# Patient Record
Sex: Male | Born: 1965
Health system: Southern US, Community
[De-identification: ages and names within clinical notes are randomized; demographics above are authoritative.]

## PROBLEM LIST (undated history)

## (undated) HISTORY — PX: KNEE SURGERY: SHX244

---

## 1994-08-09 HISTORY — PX: HAND SURGERY: SHX662

## 2006-07-07 ENCOUNTER — Emergency Department (HOSPITAL_COMMUNITY): Admission: EM | Admit: 2006-07-07 | Discharge: 2006-07-07 | Payer: Self-pay | Admitting: Emergency Medicine

## 2007-12-17 ENCOUNTER — Emergency Department (HOSPITAL_COMMUNITY): Admission: EM | Admit: 2007-12-17 | Discharge: 2007-12-17 | Payer: Self-pay | Admitting: Emergency Medicine

## 2008-09-23 ENCOUNTER — Ambulatory Visit (HOSPITAL_BASED_OUTPATIENT_CLINIC_OR_DEPARTMENT_OTHER): Admission: RE | Admit: 2008-09-23 | Discharge: 2008-09-23 | Payer: Self-pay | Admitting: Orthopedic Surgery

## 2009-05-06 ENCOUNTER — Emergency Department (HOSPITAL_COMMUNITY): Admission: EM | Admit: 2009-05-06 | Discharge: 2009-05-06 | Payer: Self-pay | Admitting: Family Medicine

## 2009-06-22 ENCOUNTER — Emergency Department (HOSPITAL_COMMUNITY): Admission: EM | Admit: 2009-06-22 | Discharge: 2009-06-22 | Payer: Self-pay | Admitting: Emergency Medicine

## 2010-11-24 LAB — POCT HEMOGLOBIN-HEMACUE: Hemoglobin: 14.5 g/dL (ref 13.0–17.0)

## 2010-12-22 NOTE — Op Note (Signed)
NAME:  Michael Clarke, Michael Clarke                ACCOUNT NO.:  [id number]   MEDICAL RECORD NO.:  [id number]          PATIENT TYPE:  AMB   LOCATION:  [facility]                         FACILITY:  [facility]   PHYSICIAN:  Marlowe Kays, M.D.  DATE OF BIRTH:  04/17/1966   DATE OF PROCEDURE:  09/23/2008  DATE OF DISCHARGE:                               OPERATIVE REPORT   PREOPERATIVE DIAGNOSES:  1. Torn medial meniscus.  2. Osteoarthritis right knee.   POSTOPERATIVE DIAGNOSES:  1. Torn medial meniscus.  2. Osteoarthritis right knee.   OPERATION:  Right knee arthroscopy with:  1. Partial medial meniscectomy.  2. Shaving of medial femoral condyle.   SURGEON:  J. Aplington, M.D.   ASSISTANT:  Nurse.   ANESTHESIA:  General.   FINAL JUSTIFICATION FOR PROCEDURE:  Because of a right knee injury on  the job, he had right knee MRI performed ON May 02, 2008 showing a  complex degenerative tear of the posterior horn of the medial meniscus  associated with osteoarthritis, particularly the medial compartment.  He  has been somewhat reluctant to have surgery, but, because of progressive  pain, has now consented to have surgical correction.  Potential risks,  complications and expected results have been all thoroughly discussed  with him.   PROCEDURE:  Satisfactory general anesthesia, Ace wrap and knee support  to left lower extremity, pneumatic tourniquet to right lower extremity.  Right leg Esmarched out non-sterilely and the tourniquet inflated to 325  mmHg, a thigh stabilizer applied and the right leg prepped with DuraPrep  from stabilizer to ankle and draped in a sterile field.  Time-out  performed.  Superior medial saline inflow.  First through an  anteromedial portal lateral compartment, the joint was evaluated. It was  essentially normal with minimal fraying of the lateral meniscus and no  chondromalacia.  Looking at the lateral gutter and suprapatellar area,  he did have a little wear of the  patella, particularly in the medial  facet but there was nothing operable.  I then reversed portals.  The ACL  was intact.  He had a little bit of reactive synovitis anteriorly which  I resected with a 3.5 shaver.  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  The major  pathology was an extensive tear involving the entire posterior medial  meniscus from just prior to the curve into the intercondylar area with  an extensive complex tear around the posterior curve.  I resected the  torn portion of the meniscus back to a stable rim primarily with a small  angled upbiting basket and then shaved the remaining rim down until  smooth with a 3.5 shaver.  The remaining meniscus was stable on probing.  Final pictures were taken.  The joint was irrigated to clear and all  fluid possible removed.  I closed the 2 anterior portals with 4-0 nylon  and then injected 20 mL of half-  percent Marcaine, adrenaline and 4 mg of morphine through the inflow  apparatus which I removed and closed this portal with 4-0 nylon  as well.  Betadine and a dry sterile dressing were applied.  The tourniquet was  released.  He tolerated the procedure well and was taken to recovery in  satisfactory condition with no known complications.           ______________________________  Marlowe Kays, M.D.     JA/MEDQ  D:  09/23/2008  T:  09/23/2008  Job:  [id number]

## 2011-02-21 ENCOUNTER — Inpatient Hospital Stay (INDEPENDENT_AMBULATORY_CARE_PROVIDER_SITE_OTHER)
Admission: RE | Admit: 2011-02-21 | Discharge: 2011-02-21 | Disposition: A | Discharge status: Home / Self Care | Payer: BC Managed Care – PPO | Source: Ambulatory Visit | Attending: Family Medicine | Admitting: Family Medicine

## 2011-02-21 DIAGNOSIS — H109 Unspecified conjunctivitis: Secondary | ICD-10-CM

## 2013-07-19 ENCOUNTER — Encounter (INDEPENDENT_AMBULATORY_CARE_PROVIDER_SITE_OTHER): Payer: Self-pay

## 2013-07-19 ENCOUNTER — Encounter: Payer: Self-pay | Admitting: Internal Medicine

## 2013-07-19 ENCOUNTER — Ambulatory Visit: Payer: PRIVATE HEALTH INSURANCE | Attending: Internal Medicine | Admitting: Internal Medicine

## 2013-07-19 ENCOUNTER — Ambulatory Visit: Payer: PRIVATE HEALTH INSURANCE

## 2013-07-19 VITALS — BP 140/70 | HR 72 | Temp 98.2°F | Resp 16 | Wt 200.0 lb

## 2013-07-19 DIAGNOSIS — M25539 Pain in unspecified wrist: Secondary | ICD-10-CM | POA: Insufficient documentation

## 2013-07-19 DIAGNOSIS — M25532 Pain in left wrist: Secondary | ICD-10-CM | POA: Insufficient documentation

## 2013-07-19 DIAGNOSIS — M79675 Pain in left toe(s): Secondary | ICD-10-CM

## 2013-07-19 DIAGNOSIS — M79609 Pain in unspecified limb: Secondary | ICD-10-CM

## 2013-07-19 DIAGNOSIS — Z139 Encounter for screening, unspecified: Secondary | ICD-10-CM

## 2013-07-19 DIAGNOSIS — R030 Elevated blood-pressure reading, without diagnosis of hypertension: Secondary | ICD-10-CM

## 2013-07-19 MED ORDER — DICLOFENAC SODIUM 75 MG PO TBEC: 75.0000 mg | DELAYED_RELEASE_TABLET | Freq: Two times a day (BID) | ORAL | Status: DC

## 2013-07-19 NOTE — Progress Notes (Signed)
Patient here to establish care Complains of pain to left wrist- hurt it over the summer playing volleyball  And still not feeling better Has some pain to his toe on his left foot

## 2013-07-19 NOTE — Patient Instructions (Signed)

## 2013-07-19 NOTE — Progress Notes (Signed)
MRN: [id number] Name: Michael Clarke  Sex: male Age: 47 y.o. DOB: 11-22-1965  Allergies: Penicillins  Chief Complaint  Patient presents with  . Establish Care    HPI: Patient is 47 y.o. male who comes for the first time to establish medical care, denies any previous medical history today his blood pressure was elevated, denies any headache dizziness chest pain shortness of breath, repeat blood pressure is still 140/70 manual, reported to have family history of hypertension, patient also complained of left wrist pain for the last several months as per patient started after he was playing vollyball, he also reported to have on and off pain in his left foot second toe denies any fever chills or any swelling. Patient tried over-the-counter ibuprofen without much improvement.  History reviewed. No pertinent past medical history.  Past Surgical History  Procedure Laterality Date  . Knee surgery Right       Medication List       This list is accurate as of: 07/19/13 11:04 AM.  Always use your most recent med list.               diclofenac 75 MG EC tablet  Commonly known as:  VOLTAREN  Take 1 tablet (75 mg total) by mouth 2 (two) times daily.        Meds ordered this encounter  Medications  . diclofenac (VOLTAREN) 75 MG EC tablet    Sig: Take 1 tablet (75 mg total) by mouth 2 (two) times daily.    Dispense:  30 tablet    Refill:  2     There is no immunization history on file for this patient.  History  Substance Use Topics  . Smoking status: Heavy Tobacco Smoker -- 0.50 packs/day for 10 years  . Smokeless tobacco: Not on file  . Alcohol Use: Yes     Comment: occasional    Review of Systems  As noted in HPI  Filed Vitals:   07/19/13 1055  BP: 140/70  Pulse:   Temp:   Resp:     Physical Exam  Physical Exam  Constitutional: He is oriented to person, place, and time.  Eyes: EOM are normal. Pupils are equal, round, and reactive to light.  Cardiovascular:  Normal rate and regular rhythm.   Pulmonary/Chest: Breath sounds normal. No respiratory distress. He has no wheezes. He has no rales.  Musculoskeletal:  Left wrist Some tenderness on the radial side good range of motion  Left foot second toe no inflammation ? Callus formation at the tip nontender, no signs of infection.  Neurological: He is alert and oriented to person, place, and time.    CBC    Component Value Date/Time   HGB 14.5 09/23/2008 1352    CMP  No results found for this basename: na,  k,  cl,  co2,  glucose,  bun,  creatinine,  calcium,  prot,  albumin,  ast,  alt,  alkphos,  bilitot,  gfrnonaa,  gfraa    No results found for this basename: chol,  tri,  ldl    No components found with this basename: hga1c    No results found for this basename: AST    Assessment and Plan  Left wrist pain - Plan: DG Wrist Complete Left, diclofenac (VOLTAREN) 75 MG EC tablet  Pain in toe of left foot - Plan: diclofenac (VOLTAREN) 75 MG EC tablet ... if problem is persistent consider referral to podiatry.  Elevated BP... advised patient for low salt diet and  exercise.  Screening - Plan: CBC with Differential, COMPLETE METABOLIC PANEL WITH GFR, TSH, Lipid panel, Vit D  25 hydroxy (rtn osteoporosis monitoring)   Health Maintenance  patient declined for flu shot.  Return in about 6 weeks (around 08/30/2013).  Doris Cheadle, MD

## 2013-08-30 ENCOUNTER — Ambulatory Visit: Payer: Self-pay | Admitting: Internal Medicine

## 2013-09-19 ENCOUNTER — Telehealth: Payer: Self-pay

## 2013-09-19 NOTE — Telephone Encounter (Signed)
Patient called stating has been having some erectile dysfunction Would like us to prescribe cialis

## 2013-09-27 ENCOUNTER — Telehealth: Payer: Self-pay | Admitting: Internal Medicine

## 2013-09-27 NOTE — Telephone Encounter (Signed)
Returned patient call Patient stated he already had spoken to someone about his issue

## 2013-09-27 NOTE — Telephone Encounter (Signed)
Pt was last seen on 07/19/13.  Pt says he is having issues with erectile dysfunction and wanted to learn more about script for med like Cialis.

## 2013-10-01 ENCOUNTER — Ambulatory Visit: Payer: Self-pay

## 2013-11-28 ENCOUNTER — Encounter (HOSPITAL_COMMUNITY): Payer: Self-pay | Admitting: Emergency Medicine

## 2013-11-28 ENCOUNTER — Emergency Department (HOSPITAL_COMMUNITY)
Admission: EM | Admit: 2013-11-28 | Discharge: 2013-11-28 | Disposition: A | Discharge status: Home / Self Care | Payer: PRIVATE HEALTH INSURANCE | Source: Home / Self Care | Attending: Family Medicine | Admitting: Family Medicine

## 2013-11-28 DIAGNOSIS — L0291 Cutaneous abscess, unspecified: Secondary | ICD-10-CM

## 2013-11-28 DIAGNOSIS — L0390 Cellulitis, unspecified: Secondary | ICD-10-CM

## 2013-11-28 MED ORDER — SULFAMETHOXAZOLE-TRIMETHOPRIM 800-160 MG PO TABS: 2.0000 | ORAL_TABLET | Freq: Two times a day (BID) | ORAL | Status: DC

## 2013-11-28 MED ORDER — SULFAMETHOXAZOLE-TRIMETHOPRIM 800-160 MG PO TABS: 1.0000 | ORAL_TABLET | Freq: Two times a day (BID) | ORAL | Status: DC

## 2013-11-28 NOTE — ED Provider Notes (Signed)
Michael Clarke is a 48 y.o. male who presents to Urgent Care today for abscess. Patient developed an abscess on the skin of the right abdominal wall about 5 days ago. He was able to get it to drain 2 days ago. The pain is much better however he has a small indurated area surrounding the abscess. He thinks he possibly needs antibiotics. No fevers chills nausea vomiting or diarrhea. He is using warm compress daily.   History reviewed. No pertinent past medical history. History  Substance Use Topics  . Smoking status: Heavy Tobacco Smoker -- 0.50 packs/day for 10 years  . Smokeless tobacco: Not on file  . Alcohol Use: Yes     Comment: occasional   ROS as above Medications: No current facility-administered medications for this encounter.   Current Outpatient Prescriptions  Medication Sig Dispense Refill  . sulfamethoxazole-trimethoprim (SEPTRA DS) 800-160 MG per tablet Take 1 tablet by mouth 2 (two) times daily.  28 tablet  0    Exam:  BP 127/86  Pulse 69  Temp(Src) 98.4 F (36.9 C) (Oral)  Resp 18  SpO2 96% Gen: Well NAD Skin: Small indurated and mildly tender area surrounding old abscess. No fluctuance.    Assessment and Plan: 48 y.o. male with resolving abscess. Plan to treat with Bactrim. Followup as needed.  Discussed warning signs or symptoms. Please see discharge instructions. Patient expresses understanding.    Rodolph BongEvan S Travelle Mcclimans, MD 11/28/13 1052

## 2013-11-28 NOTE — ED Notes (Signed)
Pt c/o abscess on right side/rib cage onset 5 days Reports he popped the abscess and drained it completely  Just wants to make sure it's not infected or that he needs to be on antibiotics Pain has decreased from 10 to 2 Alert w/no signs of acute distress.

## 2013-11-28 NOTE — Discharge Instructions (Signed)
Thank you for coming in today. Continue warm compress.  Take Bactrim twice daily for one week.  Come back as needed  Abscess An abscess is an infected area that contains a collection of pus and debris.It can occur in almost any part of the body. An abscess is also known as a furuncle or boil. CAUSES  An abscess occurs when tissue gets infected. This can occur from blockage of oil or sweat glands, infection of hair follicles, or a minor injury to the skin. As the body tries to fight the infection, pus collects in the area and creates pressure under the skin. This pressure causes pain. People with weakened immune systems have difficulty fighting infections and get certain abscesses more often.  SYMPTOMS Usually an abscess develops on the skin and becomes a painful mass that is red, warm, and tender. If the abscess forms under the skin, you may feel a moveable soft area under the skin. Some abscesses break open (rupture) on their own, but most will continue to get worse without care. The infection can spread deeper into the body and eventually into the bloodstream, causing you to feel ill.  DIAGNOSIS  Your caregiver will take your medical history and perform a physical exam. A sample of fluid may also be taken from the abscess to determine what is causing your infection. TREATMENT  Your caregiver may prescribe antibiotic medicines to fight the infection. However, taking antibiotics alone usually does not cure an abscess. Your caregiver may need to make a small cut (incision) in the abscess to drain the pus. In some cases, gauze is packed into the abscess to reduce pain and to continue draining the area. HOME CARE INSTRUCTIONS   Only take over-the-counter or prescription medicines for pain, discomfort, or fever as directed by your caregiver.  If you were prescribed antibiotics, take them as directed. Finish them even if you start to feel better.  If gauze is used, follow your caregiver's directions  for changing the gauze.  To avoid spreading the infection:  Keep your draining abscess covered with a bandage.  Wash your hands well.  Do not share personal care items, towels, or whirlpools with others.  Avoid skin contact with others.  Keep your skin and clothes clean around the abscess.  Keep all follow-up appointments as directed by your caregiver. SEEK MEDICAL CARE IF:   You have increased pain, swelling, redness, fluid drainage, or bleeding.  You have muscle aches, chills, or a general ill feeling.  You have a fever. MAKE SURE YOU:   Understand these instructions.  Will watch your condition.  Will get help right away if you are not doing well or get worse. Document Released: 05/05/2005 Document Revised: 01/25/2012 Document Reviewed: 10/08/2011 [facility] CenterExit[facility] Patient Information 2014 [place], [place].

## 2014-03-19 ENCOUNTER — Ambulatory Visit: Payer: Self-pay

## 2014-12-24 ENCOUNTER — Emergency Department (INDEPENDENT_AMBULATORY_CARE_PROVIDER_SITE_OTHER)
Admission: EM | Admit: 2014-12-24 | Discharge: 2014-12-24 | Disposition: A | Discharge status: Home / Self Care | Payer: Self-pay | Source: Home / Self Care | Attending: Family Medicine | Admitting: Family Medicine

## 2014-12-24 ENCOUNTER — Encounter (HOSPITAL_COMMUNITY): Payer: Self-pay | Admitting: *Deleted

## 2014-12-24 DIAGNOSIS — J020 Streptococcal pharyngitis: Secondary | ICD-10-CM

## 2014-12-24 LAB — POCT RAPID STREP A: STREPTOCOCCUS, GROUP A SCREEN (DIRECT): NEGATIVE

## 2014-12-24 MED ORDER — ACETAMINOPHEN 325 MG PO TABS
ORAL_TABLET | ORAL | Status: AC
  Filled 2014-12-24: qty 2

## 2014-12-24 MED ORDER — CEFDINIR 300 MG PO CAPS: 300.0000 mg | ORAL_CAPSULE | Freq: Two times a day (BID) | ORAL | Status: DC

## 2014-12-24 MED ORDER — ACETAMINOPHEN 325 MG PO TABS
650.0000 mg | ORAL_TABLET | Freq: Once | ORAL | Status: AC
  Administered 2014-12-24: 650 mg via ORAL

## 2014-12-24 NOTE — ED Notes (Signed)
C/o fever onset yesterday.  Throat is a little. States he was exposed to strep throat yesterday.

## 2014-12-24 NOTE — ED Provider Notes (Signed)
CSN: [id number]     Arrival date & time 12/24/14  1603 History   First MD Initiated Contact with Patient 12/24/14 1702     Chief Complaint  Patient presents with  . Fever   (Consider location/radiation/quality/duration/timing/severity/associated sxs/prior Treatment) Patient is a 49 y.o. male presenting with pharyngitis. The history is provided by the patient.  Sore Throat This is a new problem. The current episode started yesterday (kissed a women yest who notified him todat that she has strep.). The problem has been gradually worsening. Pertinent negatives include no chest pain and no abdominal pain. The symptoms are aggravated by swallowing.    History reviewed. No pertinent past medical history. Past Surgical History  Procedure Laterality Date  . Knee surgery Right   . Hand surgery Left 1996    fracture with plate and screws   Family History  Problem Relation Age of Onset  . Hypertension Mother   . Cancer Father    History  Substance Use Topics  . Smoking status: Heavy Tobacco Smoker -- 0.50 packs/day for 10 years  . Smokeless tobacco: Not on file  . Alcohol Use: Yes     Comment: occasional    Review of Systems  Constitutional: Positive for fever.  HENT: Positive for sore throat. Negative for congestion, postnasal drip and rhinorrhea.   Respiratory: Negative.   Cardiovascular: Negative for chest pain.  Gastrointestinal: Negative for abdominal pain.    Allergies  Penicillins  Home Medications   Prior to Admission medications   Medication Sig Start Date End Date Taking? Authorizing Provider  cefdinir (OMNICEF) 300 MG capsule Take 1 capsule (300 mg total) by mouth 2 (two) times daily. 12/24/14   Linna HoffJames D Malini Flemings, MD  sulfamethoxazole-trimethoprim (SEPTRA DS) 800-160 MG per tablet Take 2 tablets by mouth 2 (two) times daily. 11/28/13   Rodolph BongEvan S Corey, MD   BP 128/74 mmHg  Pulse 95  Temp(Src) 102.4 F (39.1 C) (Oral)  Resp 16  SpO2 97% Physical Exam  Constitutional:  He is oriented to person, place, and time. He appears well-developed and well-nourished. No distress.  HENT:  Head: Normocephalic.  Right Ear: External ear normal.  Left Ear: External ear normal.  Mouth/Throat: Uvula is midline and mucous membranes are normal. Oropharyngeal exudate and posterior oropharyngeal erythema present. No posterior oropharyngeal edema or tonsillar abscesses.  Eyes: Conjunctivae are normal. Pupils are equal, round, and reactive to light.  Neck: Normal range of motion. Neck supple.  Lymphadenopathy:    He has no cervical adenopathy.  Neurological: He is alert and oriented to person, place, and time.  Skin: Skin is warm and dry.  Nursing note and vitals reviewed.   ED Course  Procedures (including critical care time) Labs Review Labs Reviewed  POCT RAPID STREP A ([facility] CARE ONLY)   Strep neg. Imaging Review No results found.   MDM   1. Streptococcal sore throat        Linna HoffJames D Rahman Ferrall, MD 12/24/14 1729

## 2014-12-27 LAB — CULTURE, GROUP A STREP: STREP A CULTURE: NEGATIVE

## 2015-10-24 ENCOUNTER — Emergency Department (HOSPITAL_BASED_OUTPATIENT_CLINIC_OR_DEPARTMENT_OTHER): Payer: Self-pay

## 2015-10-24 ENCOUNTER — Encounter (HOSPITAL_BASED_OUTPATIENT_CLINIC_OR_DEPARTMENT_OTHER): Payer: Self-pay

## 2015-10-24 ENCOUNTER — Emergency Department (HOSPITAL_BASED_OUTPATIENT_CLINIC_OR_DEPARTMENT_OTHER)
Admission: EM | Admit: 2015-10-24 | Discharge: 2015-10-24 | Disposition: A | Discharge status: Home / Self Care | Payer: Self-pay | Attending: Emergency Medicine | Admitting: Emergency Medicine

## 2015-10-24 DIAGNOSIS — M25562 Pain in left knee: Secondary | ICD-10-CM | POA: Insufficient documentation

## 2015-10-24 DIAGNOSIS — Z880 Allergy status to penicillin: Secondary | ICD-10-CM | POA: Insufficient documentation

## 2015-10-24 DIAGNOSIS — F17200 Nicotine dependence, unspecified, uncomplicated: Secondary | ICD-10-CM | POA: Insufficient documentation

## 2015-10-24 DIAGNOSIS — M25462 Effusion, left knee: Secondary | ICD-10-CM | POA: Insufficient documentation

## 2015-10-24 DIAGNOSIS — Z9889 Other specified postprocedural states: Secondary | ICD-10-CM | POA: Insufficient documentation

## 2015-10-24 DIAGNOSIS — Z87828 Personal history of other (healed) physical injury and trauma: Secondary | ICD-10-CM | POA: Insufficient documentation

## 2015-10-24 MED ORDER — OXYCODONE HCL 5 MG PO TABS: 5.0000 mg | ORAL_TABLET | ORAL | Status: AC | PRN

## 2015-10-24 NOTE — Discharge Instructions (Signed)
Please read and follow all provided instructions.  Your diagnoses today include:  1. Left knee pain    Tests performed today include:  Vital signs. See below for your results today.   Medications prescribed:  You can use Ibuprofen 400mg  combined with Tylenol 1000mg  for pain relief. Do not exceed 4g of Tylenol in one 24 hour period. Use narcotics if pain uncontrolled with the aforementioned regiment.   Home care instructions:  Follow any educational materials contained in this packet.  Follow-up instructions: Please follow-up with your primary care provider in the next 48 hours for further evaluation of symptoms and treatment   Return instructions:   Please return to the Emergency Department if you do not get better, if you get worse, or new symptoms OR  - Fever (temperature greater than 101.29F)  - Bleeding that does not stop with holding pressure to the area    -Severe pain (please note that you may be more sore the day after your accident)  - Chest Pain  - Difficulty breathing  - Severe nausea or vomiting  - Inability to tolerate food and liquids  - Passing out  - Skin becoming red around your wounds  - Change in mental status (confusion or lethargy)  - New numbness or weakness     Please return if you have any other emergent concerns.  Additional Information:  Your vital signs today were: BP 149/77 mmHg   Pulse 82   Temp(Src) 98 F (36.7 C) (Oral)   Resp 16   Ht 6' (1.829 m)   Wt 84.823 kg   BMI 25.36 kg/m2   SpO2 100% If your blood pressure (BP) was elevated above 135/85 this visit, please have this repeated by your doctor within one month. ---------------

## 2015-10-24 NOTE — ED Notes (Signed)
Left knee pain, swelling x 2 weeks-NAD-steady gait

## 2015-10-24 NOTE — ED Provider Notes (Signed)
CSN: [id number]     Arrival date & time 10/24/15  1233 History   First MD Initiated Contact with Patient 10/24/15 1241     Chief Complaint  Patient presents with  . Knee Pain   (Consider location/radiation/quality/duration/timing/severity/associated sxs/prior Treatment) HPI 50 y.o. male with a hx of mensical tear of right knee, presents to the Emergency Department today complaining of left knee pain/swelling x 2 weeks. Notes pain with ambulation. Knee pain is 9/10 and sore feeling. Tried OTC ibuprofen as well as heat/ice. Does not endorse any trauma to the area. Able to ambulate well. Noted swelling. No fevers. No N/V/D. No other symptoms noted. Unsure of Orthopedist who evaluated right knee.   History reviewed. No pertinent past medical history. Past Surgical History  Procedure Laterality Date  . Knee surgery Right   . Hand surgery Left 1996    fracture with plate and screws   Family History  Problem Relation Age of Onset  . Hypertension Mother   . Cancer Father    Social History  Substance Use Topics  . Smoking status: Current Every Day Smoker -- 0.50 packs/day for 10 years  . Smokeless tobacco: None  . Alcohol Use: Yes     Comment: occasional    Review of Systems ROS reviewed and all are negative for acute change except as noted in the HPI.  Allergies  Penicillins  Home Medications   Prior to Admission medications   Not on File   BP 149/77 mmHg  Pulse 82  Temp(Src) 98 F (36.7 C) (Oral)  Resp 16  Ht 6' (1.829 m)  Wt 84.823 kg  BMI 25.36 kg/m2  SpO2 100% Physical Exam  Constitutional: He is oriented to person, place, and time. He appears well-developed and well-nourished.  HENT:  Head: Normocephalic and atraumatic.  Eyes: EOM are normal.  Neck: Normal range of motion.  Cardiovascular: Normal rate and regular rhythm.   Pulmonary/Chest: Effort normal.  Abdominal: Soft.  Musculoskeletal: Normal range of motion.       Left knee: He exhibits swelling. He  exhibits normal range of motion, no ecchymosis, no deformity, no laceration and no erythema. Tenderness found. Medial joint line tenderness noted.  Left Knee: ACL/PCL/MCL/LCL appear intact. Meniscus intact. No catching on exam. Swelling noted antero-lateral aspect. No erythema or signs of cellulitic infection. Full ROM without difficulty   Neurological: He is alert and oriented to person, place, and time.  Skin: Skin is warm and dry.  Psychiatric: He has a normal mood and affect. His behavior is normal. Thought content normal.  Nursing note and vitals reviewed.   ED Course  Procedures (including critical care time) Labs Review Labs Reviewed - No data to display  Imaging Review Dg Knee Complete 4 Views Left  10/24/2015  CLINICAL DATA:  Pain and swelling EXAM: LEFT KNEE - COMPLETE 4+ VIEW COMPARISON:  None. FINDINGS: Frontal, lateral, and bilateral oblique views were obtained. There is a degree of generalized soft tissue swelling. There is no demonstrable fracture or dislocation. No appreciable joint effusion. There is mild generalized joint space narrowing. No erosive change. IMPRESSION: Mild generalized osteoarthritic change. No appreciable joint effusion. No fracture or dislocation. Generalized soft tissue swelling is noted of uncertain etiology. Electronically Signed   By: Bretta Bang III M.D.   On: 10/24/2015 13:16   I have personally reviewed and evaluated these images and lab results as part of my medical decision-making.   EKG Interpretation None      MDM  I have  reviewed and evaluated the relevant imaging studies. I have reviewed the relevant previous healthcare records. I obtained HPI from historian.  ED Course:  Assessment: Pt is a 49yM who presents with left knee pain x2weeks. On exam, pt in NAD. Nontoxic/nonseptic appearing. VSS. Afebrile. Left knee Full ROM. Swelling noted on antero-lateral aspect. No erythema or signs of cellulitis. ACL/PCL/MCL/LCL intact. Meniscus  appear intact. Xray left knee showed no acute abnormalities. Noted osteoarthritic changes. Given ace wrap in ED. Plan is to DC home with analgesia. Given information for Orthopedic evalation. At time of discharge, Patient is in no acute distress. Vital Signs are stable. Patient is able to ambulate. Patient able to tolerate PO.    Disposition/Plan:  DC Home Additional Verbal discharge instructions given and discussed with patient.  Pt Instructed to f/u with Orthopedics for evaluation and treatment of symptoms. Return precautions given Pt acknowledges and agrees with plan  Supervising Physician Benjiman CoreNathan Pickering, MD   Final diagnoses:  Left knee pain     Audry Piliyler Iraida Cragin, PA-C 10/24/15 1327  Benjiman CoreNathan Pickering, MD 10/24/15 1416

## 2017-02-15 ENCOUNTER — Encounter (HOSPITAL_BASED_OUTPATIENT_CLINIC_OR_DEPARTMENT_OTHER): Payer: Self-pay | Admitting: Emergency Medicine

## 2017-02-15 ENCOUNTER — Emergency Department (HOSPITAL_BASED_OUTPATIENT_CLINIC_OR_DEPARTMENT_OTHER)
Admission: EM | Admit: 2017-02-15 | Discharge: 2017-02-15 | Disposition: A | Discharge status: Home / Self Care | Payer: Self-pay | Attending: Emergency Medicine | Admitting: Emergency Medicine

## 2017-02-15 DIAGNOSIS — W19XXXA Unspecified fall, initial encounter: Secondary | ICD-10-CM

## 2017-02-15 DIAGNOSIS — Y929 Unspecified place or not applicable: Secondary | ICD-10-CM | POA: Insufficient documentation

## 2017-02-15 DIAGNOSIS — Y999 Unspecified external cause status: Secondary | ICD-10-CM | POA: Insufficient documentation

## 2017-02-15 DIAGNOSIS — F17210 Nicotine dependence, cigarettes, uncomplicated: Secondary | ICD-10-CM | POA: Insufficient documentation

## 2017-02-15 DIAGNOSIS — M25562 Pain in left knee: Secondary | ICD-10-CM | POA: Insufficient documentation

## 2017-02-15 DIAGNOSIS — M25572 Pain in left ankle and joints of left foot: Secondary | ICD-10-CM | POA: Insufficient documentation

## 2017-02-15 DIAGNOSIS — W11XXXA Fall on and from ladder, initial encounter: Secondary | ICD-10-CM | POA: Insufficient documentation

## 2017-02-15 DIAGNOSIS — Y939 Activity, unspecified: Secondary | ICD-10-CM | POA: Insufficient documentation

## 2017-02-15 DIAGNOSIS — M79672 Pain in left foot: Secondary | ICD-10-CM

## 2017-02-15 MED ORDER — NAPROXEN 375 MG PO TABS: 375.0000 mg | ORAL_TABLET | Freq: Two times a day (BID) | ORAL | 0 refills | Status: AC

## 2017-02-15 MED ORDER — IBUPROFEN 200 MG PO TABS
600.0000 mg | ORAL_TABLET | Freq: Once | ORAL | Status: AC
  Administered 2017-02-15: 600 mg via ORAL
  Filled 2017-02-15: qty 1

## 2017-02-15 MED ORDER — CYCLOBENZAPRINE HCL 10 MG PO TABS: 10.0000 mg | ORAL_TABLET | Freq: Two times a day (BID) | ORAL | 0 refills | Status: AC | PRN

## 2017-02-15 MED FILL — NAPROXEN 375 MG TABLET: 375 | 7 days supply | Qty: 14 | Fill #0

## 2017-02-15 MED FILL — CYCLOBENZAPRINE 10 MG TABLE: 10 | 8 days supply | Qty: 15 | Fill #0

## 2017-02-15 NOTE — ED Triage Notes (Signed)
Pt was on a ladder and fell off the ladder to the side.  No head injury, no loc.  Pt c/o pain to left knee, left ankle, left foot and left big toe.  Good rom.

## 2017-02-15 NOTE — Discharge Instructions (Signed)
This is likely muscular skeletal pain. Please rest, ice, compress and elevated the affected body part to help with swelling and pain. Follow-up with orthopedist if symptoms are not improving. Please take the Naproxen as prescribed for pain 2 times per day. Do not take any additional NSAIDs including Motrin, Aleve, Ibuprofen, Advil. Have been given first dose in the Ed today. May also take tylenol. Please the the Flexeril for muscle relaxation. This medication will make you drowsy so avoid situation that could place you in danger.

## 2017-02-15 NOTE — ED Provider Notes (Signed)
[facility] Provider Note   CSN: [id number] Arrival date & time: 02/15/17  [id number]     History   Chief Complaint Chief Complaint  Patient presents with  . Fall    HPI Aleatha Boreraul Friis is a 51 y.o. male.  HPI 51 year old with no significant past medical history presents to the emergency department today with complaints of left foot and left knee pain after mechanical fall off of a 3 foot ladder. Patient states that he lost his balance and fell twisting his left ankle and hitting his left knee. He tried to catch himself with his left hand. He denies any LOC or head injury. Has been able to weight-bear on his left leg since the accident. Moving makes the pain worse. Holding still makes the pain better. He has not tried nothing for his symptoms prior to arrival. Denies any headache, vision changes, neck pain, back pain, nausea, vomiting, abdominal pain, chest pain. No past medical history on file.  Patient Active Problem List   Diagnosis Date Noted  . Left wrist pain 07/19/2013  . Elevated BP 07/19/2013  . Pain in toe of left foot 07/19/2013    Past Surgical History:  Procedure Laterality Date  . HAND SURGERY Left 1996   fracture with plate and screws  . KNEE SURGERY Right        Home Medications    Prior to Admission medications   Medication Sig Start Date End Date Taking? Authorizing Provider  cyclobenzaprine (FLEXERIL) 10 MG tablet Take 1 tablet (10 mg total) by mouth 2 (two) times daily as needed for muscle spasms. 02/15/17   Rise MuLeaphart, Kenneth T, PA-C  naproxen (NAPROSYN) 375 MG tablet Take 1 tablet (375 mg total) by mouth 2 (two) times daily. 02/15/17   Rise MuLeaphart, Kenneth T, PA-C  oxyCODONE (ROXICODONE) 5 MG immediate release tablet Take 1 tablet (5 mg total) by mouth every 4 (four) hours as needed for severe pain. 10/24/15   Audry PiliMohr, Tyler, PA-C    Family History Family History  Problem Relation Age of Onset  . Hypertension Mother   . Cancer Father     Social  History Social History  Substance Use Topics  . Smoking status: Current Every Day Smoker    Packs/day: 0.50    Years: 10.00  . Smokeless tobacco: Never Used  . Alcohol use Yes     Comment: occasional     Allergies   Penicillins   Review of Systems Review of Systems  Constitutional: Negative for chills and fever.  Eyes: Negative for visual disturbance.  Gastrointestinal: Negative for abdominal pain, nausea and vomiting.  Musculoskeletal: Positive for arthralgias and myalgias. Negative for back pain, gait problem and neck pain.  Skin: Negative for color change and wound.  Neurological: Negative for weakness and numbness.     Physical Exam Updated Vital Signs BP (!) 151/103 (BP Location: Right Arm)   Pulse 68   Temp 97.8 F (36.6 C) (Oral)   Resp 16   Ht 6\' 1"  (1.854 m)   Wt 88.5 kg (195 lb)   SpO2 100%   BMI 25.73 kg/m   Physical Exam  Constitutional: He appears well-developed and well-nourished. No distress.  HENT:  Head: Normocephalic and atraumatic.  Eyes: Right eye exhibits no discharge. Left eye exhibits no discharge. No scleral icterus.  Neck: Normal range of motion. Neck supple.  No c spine midline tenderness. No paraspinal tenderness. No deformities or step offs noted. Full ROM. Supple. No nuchal rigidity.   Pulmonary/Chest: No  respiratory distress.  Abdominal: Soft. He exhibits no distension. There is no tenderness. There is no rebound and no guarding.  Musculoskeletal: Normal range of motion. He exhibits tenderness. He exhibits no edema or deformity.       Left knee: He exhibits bony tenderness. He exhibits normal range of motion, no swelling, no effusion, no ecchymosis, no deformity, no erythema, normal alignment, no LCL laxity and normal meniscus. No lateral joint line, no MCL, no LCL and no patellar tendon tenderness noted.       Left ankle: He exhibits normal range of motion, no swelling, no ecchymosis, no deformity and normal pulse. No AITFL, no CF  ligament, no posterior TFL, no head of 5th metatarsal and no proximal fibula tenderness found.  No joint laxity noted. Patella tracks normally. Negative anterior drawer or Lachman's test. No laxity with valgus and varus stress. DP pulses are 2+ bilaterally. Sensation intact. Cap refill normal.  Full range of motion of the left hip without any pain.  No midline T spine or L spine tenderness. No deformities or step offs noted. Full ROM. Pelvis is stable.   Neurological: He is alert.  Skin: Skin is warm and dry. No pallor.  Psychiatric: His behavior is normal. Judgment and thought content normal.  Nursing note and vitals reviewed.    ED Treatments / Results  Labs (all labs ordered are listed, but only abnormal results are displayed) Labs Reviewed - No data to display  EKG  EKG Interpretation None       Radiology No results found.  Procedures Procedures (including critical care time)  Medications Ordered in ED Medications  ibuprofen (ADVIL,MOTRIN) tablet 600 mg (not administered)     Initial Impression / Assessment and Plan / ED Course  I have reviewed the triage vital signs and the nursing notes.  Pertinent labs & imaging results that were available during my care of the patient were reviewed by me and considered in my medical decision making (see chart for details).     Patient presents to the ED with complaints of left foot pain, left ankle pain, left knee pain following a mechanical fall prior to arrival. Patient has been ambulatory since the event. He is neurovascularly intact in all extremities. No other obvious injuries noted. Patient did not want to receive any x-rays of his extremities. Will provide ankle brace and knee sleeve. Encouraged follow-up with PCP or orthopedics if symptoms are not improving. Discussed symptomatically treatment at home.   Pt is hemodynamically stable, in NAD, & able to ambulate in the ED. Evaluation does not show pathology that would  require ongoing emergent intervention or inpatient treatment. I explained the diagnosis to the patient. Pain has been managed & has no complaints prior to dc. Pt is comfortable with above plan and is stable for discharge at this time. All questions were answered prior to disposition. Strict return precautions for f/u to the ED were discussed. Encouraged follow up with PCP.   Final Clinical Impressions(s) / ED Diagnoses   Final diagnoses:  Fall, initial encounter  Acute pain of left knee  Left foot pain  Acute left ankle pain    New Prescriptions New Prescriptions   CYCLOBENZAPRINE (FLEXERIL) 10 MG TABLET    Take 1 tablet (10 mg total) by mouth 2 (two) times daily as needed for muscle spasms.   NAPROXEN (NAPROSYN) 375 MG TABLET    Take 1 tablet (375 mg total) by mouth 2 (two) times daily.     Demetrios Loll  T, PA-C 02/15/17 1610    Tegeler, Canary Brim, MD 02/16/17 [phone]

## 2017-09-02 IMAGING — CR DG KNEE COMPLETE 4+V*L*
4 series · 4 of 4 positions shown · non-contrast
Comparison: None.

CLINICAL DATA: Pain and swelling

EXAM:
LEFT KNEE - COMPLETE 4+ VIEW

[t knee ap left]
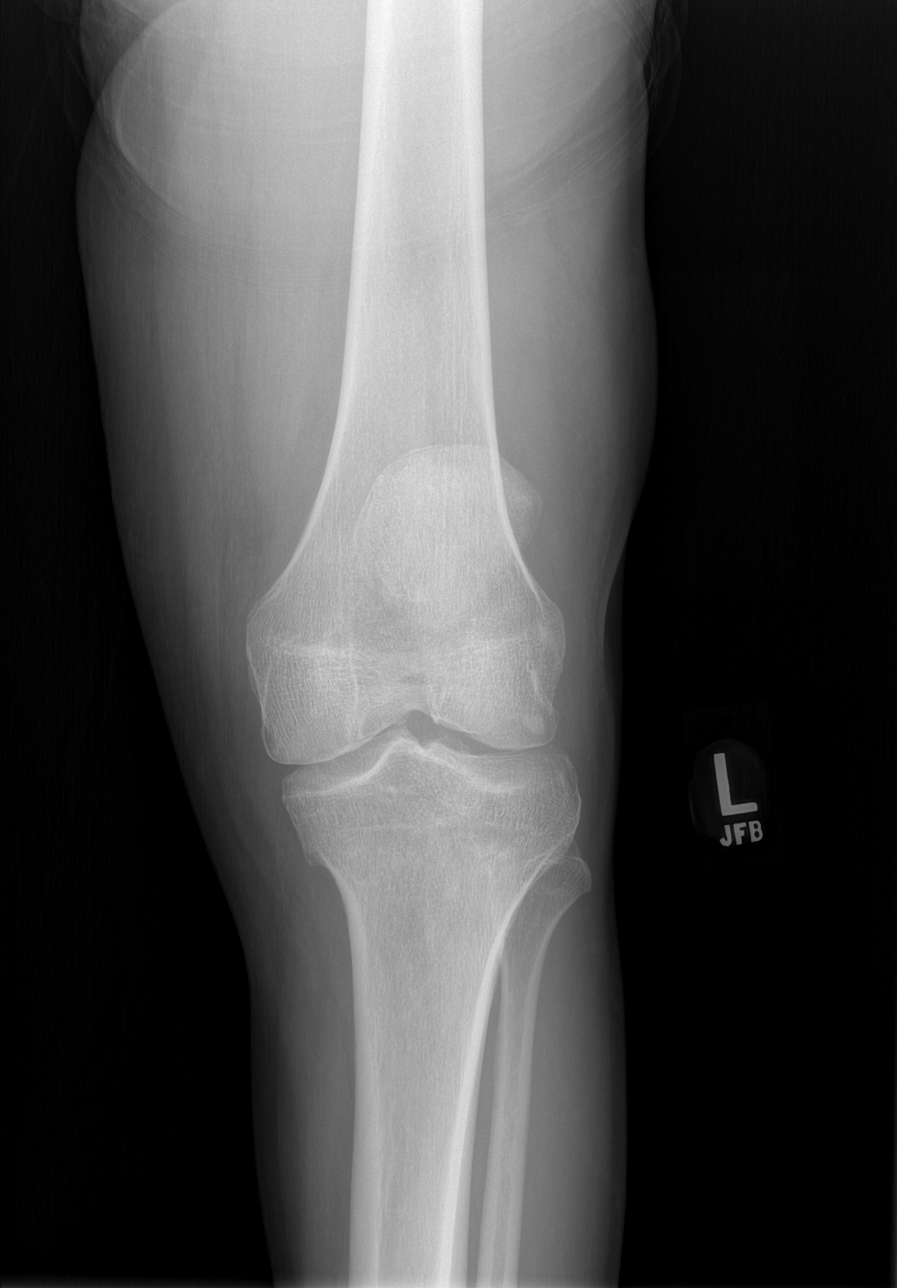

[t knee oblique left (1 of 2)]
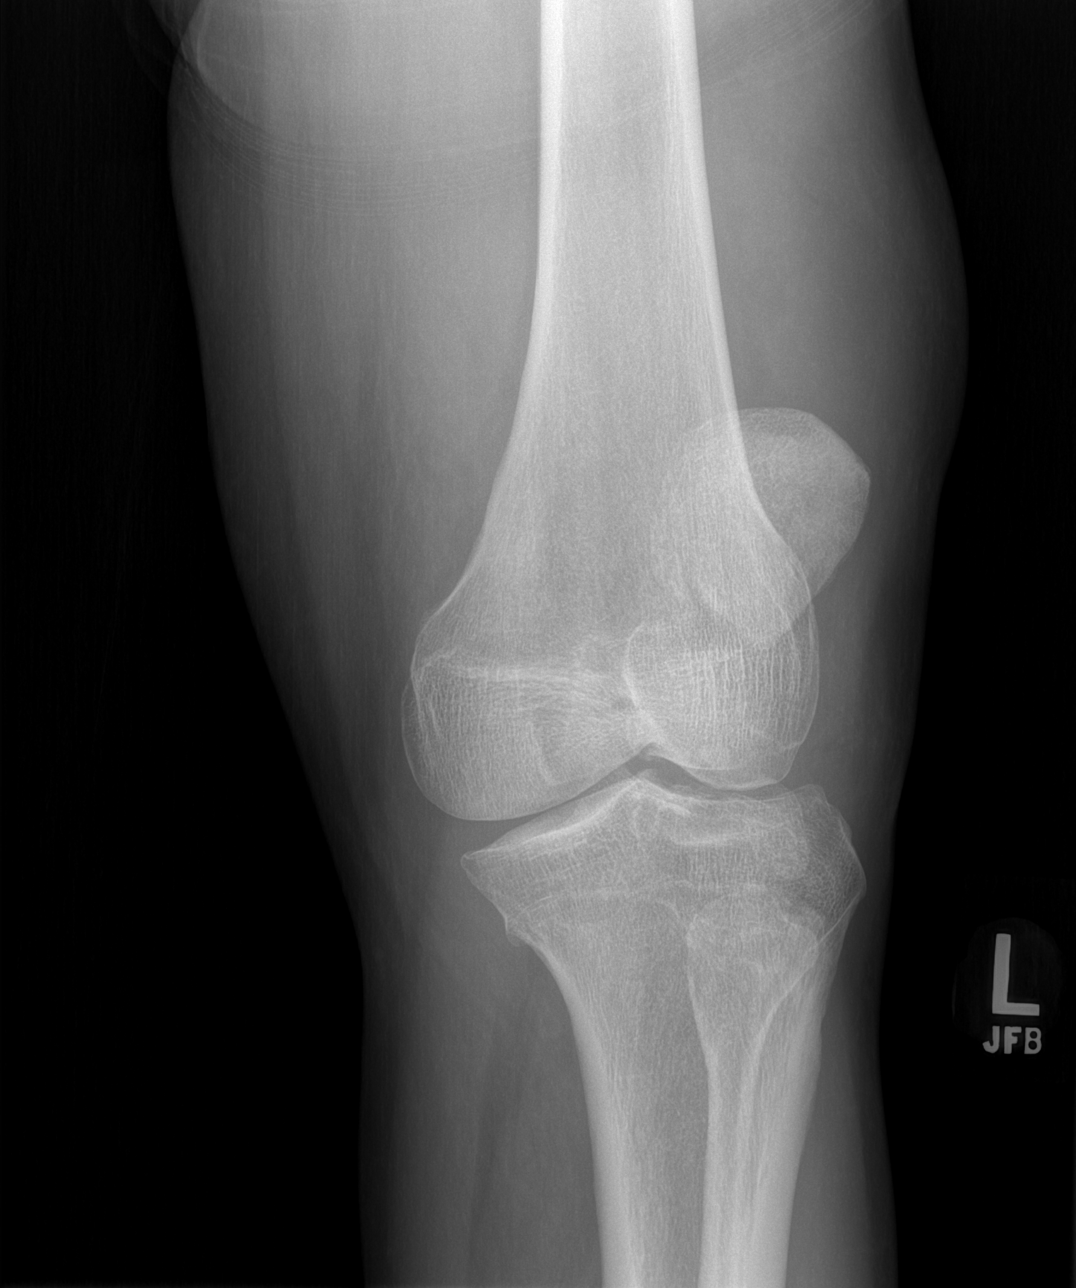

[t knee oblique left (2 of 2)]
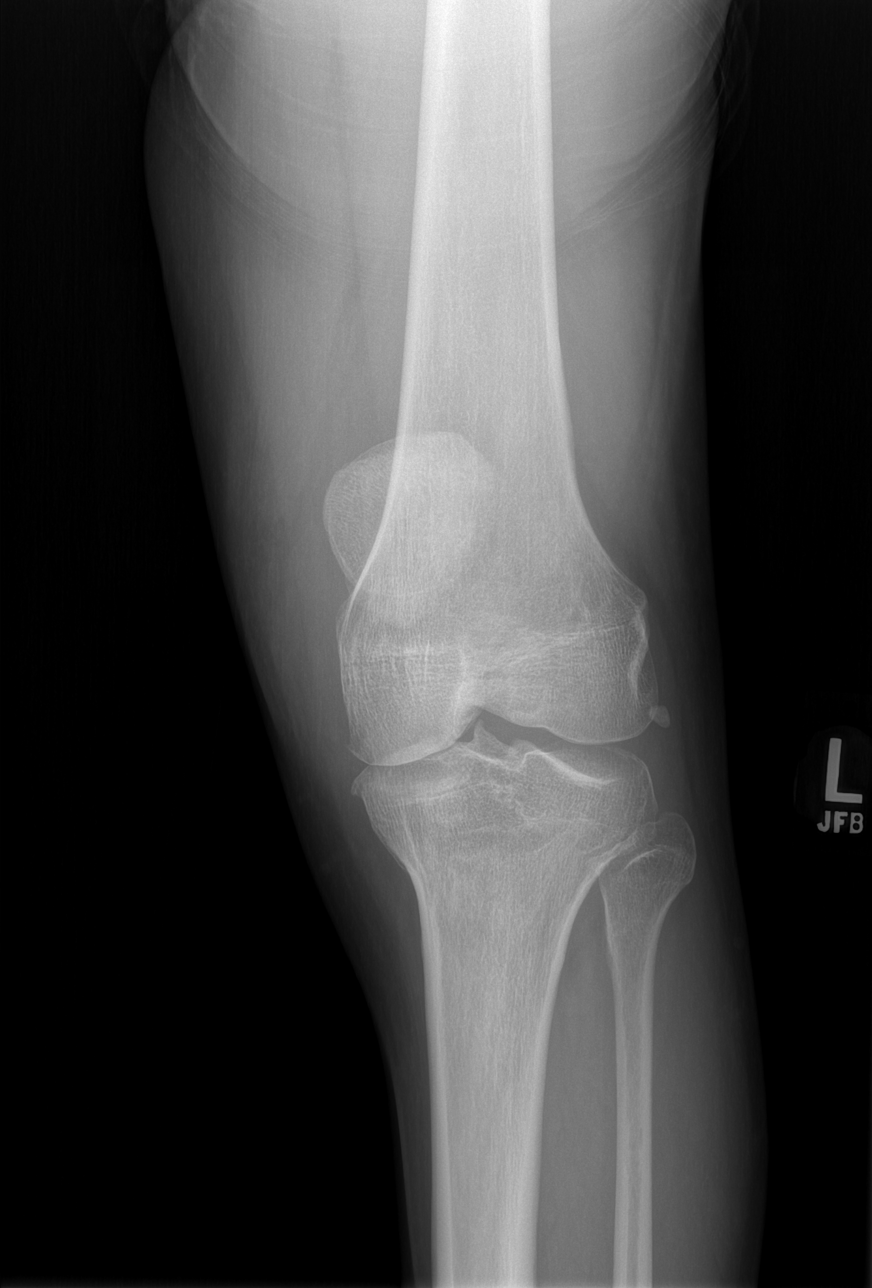

[t knee lat left]
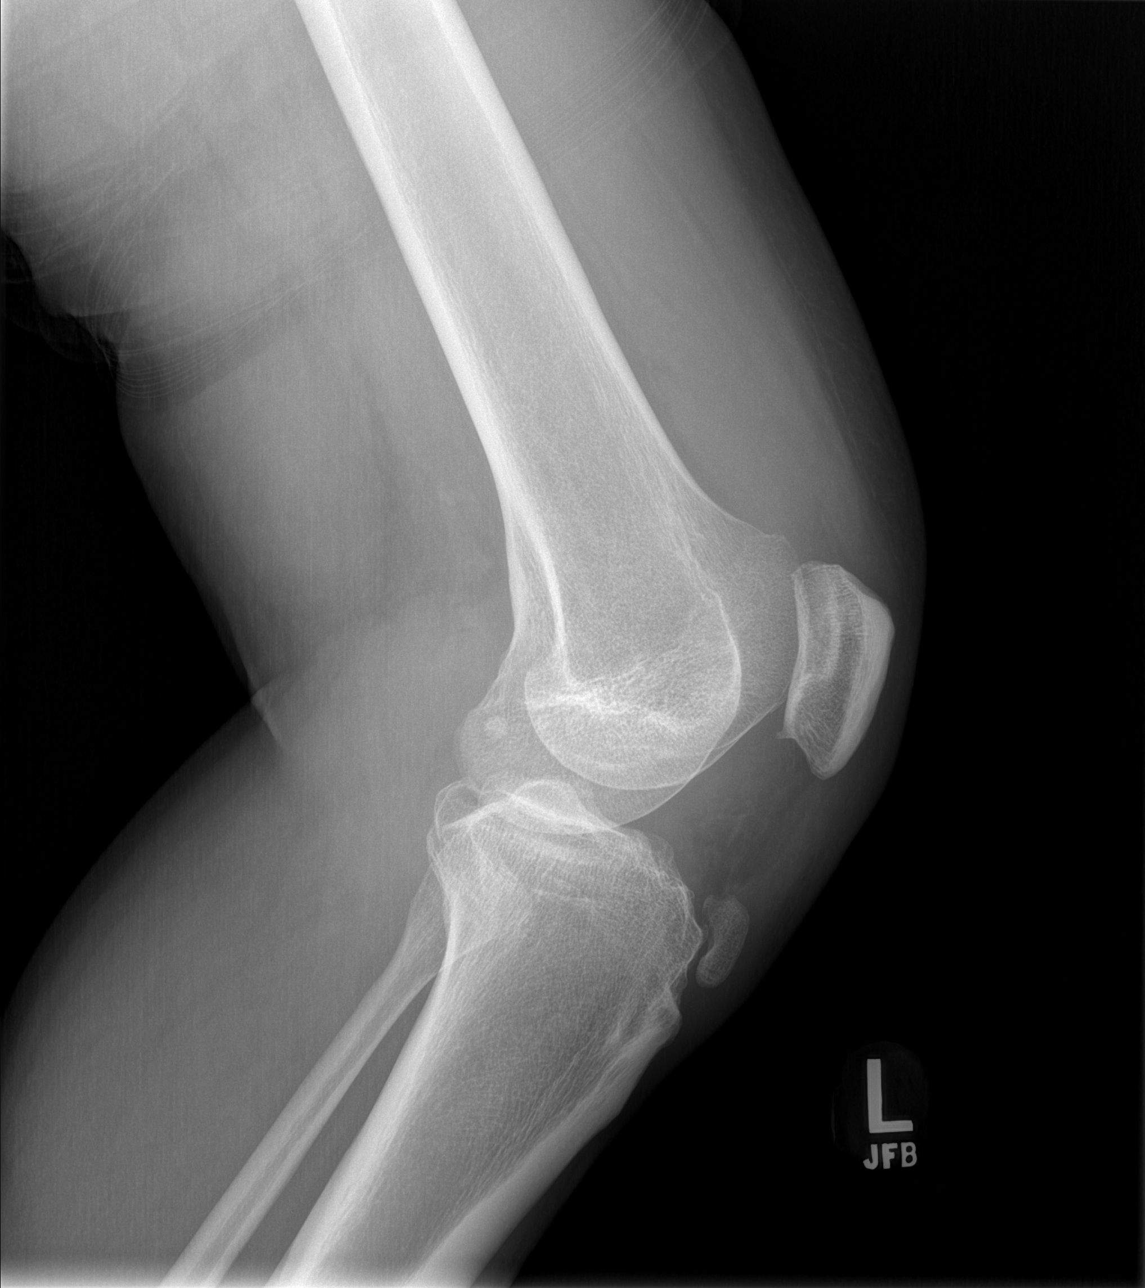

[4 of 4 positions shown; findings below may reference images not displayed]

FINDINGS: Frontal, lateral, and bilateral oblique views were obtained. There
is a degree of generalized soft tissue swelling. There is no
demonstrable fracture or dislocation. No appreciable joint effusion.
There is mild generalized joint space narrowing. No erosive change.
IMPRESSION: Mild generalized osteoarthritic change. No appreciable joint
effusion. No fracture or dislocation. Generalized soft tissue
swelling is noted of uncertain etiology.

## 2018-12-19 ENCOUNTER — Other Ambulatory Visit: Payer: Self-pay

## 2018-12-19 ENCOUNTER — Encounter (HOSPITAL_BASED_OUTPATIENT_CLINIC_OR_DEPARTMENT_OTHER): Payer: Self-pay | Admitting: *Deleted

## 2018-12-19 ENCOUNTER — Emergency Department (HOSPITAL_BASED_OUTPATIENT_CLINIC_OR_DEPARTMENT_OTHER)
Admission: EM | Admit: 2018-12-19 | Discharge: 2018-12-19 | Disposition: A | Discharge status: Home / Self Care | Payer: Self-pay | Attending: Emergency Medicine | Admitting: Emergency Medicine

## 2018-12-19 DIAGNOSIS — W19XXXD Unspecified fall, subsequent encounter: Secondary | ICD-10-CM | POA: Insufficient documentation

## 2018-12-19 DIAGNOSIS — Y9302 Activity, running: Secondary | ICD-10-CM | POA: Insufficient documentation

## 2018-12-19 DIAGNOSIS — M7021 Olecranon bursitis, right elbow: Secondary | ICD-10-CM | POA: Insufficient documentation

## 2018-12-19 DIAGNOSIS — Z79899 Other long term (current) drug therapy: Secondary | ICD-10-CM | POA: Insufficient documentation

## 2018-12-19 DIAGNOSIS — F17200 Nicotine dependence, unspecified, uncomplicated: Secondary | ICD-10-CM | POA: Insufficient documentation

## 2018-12-19 MED ORDER — LIDOCAINE-EPINEPHRINE (PF) 2 %-1:200000 IJ SOLN
10.0000 mL | Freq: Once | INTRAMUSCULAR | Status: AC
  Administered 2018-12-19: 10 mL via INTRADERMAL
  Filled 2018-12-19: qty 10

## 2018-12-19 MED ORDER — DOXYCYCLINE HYCLATE 100 MG PO CAPS: 100.0000 mg | ORAL_CAPSULE | Freq: Two times a day (BID) | ORAL | 0 refills | Status: AC

## 2018-12-19 NOTE — Discharge Instructions (Signed)
Follow-up in 48 hours for wound check.  Return sooner for rapid spreading redness fever.  Use warm compresses 4 times a day until the wound is closed.

## 2018-12-19 NOTE — ED Provider Notes (Signed)
[facility] EMERGENCY DEPARTMENT Provider Note   CSN: [id number] Arrival date & time: 12/19/18  0845    History   Chief Complaint Chief Complaint  Patient presents with   Elbow Pain    HPI Michael Clarke is a 53 y.o. male.     53 yo M with a chief complaint of right elbow pain.  Off and on for the past week or so but worsening over the past few days.  No fevers or chills feel that it is now red and swollen.  Denies trauma.  Had remote trauma about 5 weeks ago per the family he fell while running away from a dog.   The history is provided by the patient.  Illness  Severity:  Mild Onset quality:  Gradual Duration:  2 days Timing:  Constant Progression:  Worsening Chronicity:  New Associated symptoms: no abdominal pain, no chest pain, no congestion, no diarrhea, no fever, no headaches, no myalgias, no rash, no shortness of breath and no vomiting     History reviewed. No pertinent past medical history.  Patient Active Problem List   Diagnosis Date Noted   Left wrist pain 07/19/2013   Elevated BP 07/19/2013   Pain in toe of left foot 07/19/2013    Past Surgical History:  Procedure Laterality Date   HAND SURGERY Left 1996   fracture with plate and screws   KNEE SURGERY Right         Home Medications    Prior to Admission medications   Medication Sig Start Date End Date Taking? Authorizing Provider  cyclobenzaprine (FLEXERIL) 10 MG tablet Take 1 tablet (10 mg total) by mouth 2 (two) times daily as needed for muscle spasms. 02/15/17   Rise Mu, PA-C  doxycycline (VIBRAMYCIN) 100 MG capsule Take 1 capsule (100 mg total) by mouth 2 (two) times daily. One po bid x 7 days 12/19/18   Melene Plan, DO  naproxen (NAPROSYN) 375 MG tablet Take 1 tablet (375 mg total) by mouth 2 (two) times daily. 02/15/17   Rise Mu, PA-C  oxyCODONE (ROXICODONE) 5 MG immediate release tablet Take 1 tablet (5 mg total) by mouth every 4 (four) hours as needed  for severe pain. 10/24/15   Audry Pili, PA-C    Family History Family History  Problem Relation Age of Onset   Hypertension Mother    Cancer Father     Social History Social History   Tobacco Use   Smoking status: Current Every Day Smoker    Packs/day: 0.50    Years: 10.00    Pack years: 5.00   Smokeless tobacco: Never Used  Substance Use Topics   Alcohol use: Yes    Comment: occasional   Drug use: No     Allergies   Penicillins   Review of Systems Review of Systems  Constitutional: Negative for chills and fever.  HENT: Negative for congestion and facial swelling.   Eyes: Negative for discharge and visual disturbance.  Respiratory: Negative for shortness of breath.   Cardiovascular: Negative for chest pain and palpitations.  Gastrointestinal: Negative for abdominal pain, diarrhea and vomiting.  Musculoskeletal: Positive for arthralgias. Negative for myalgias.  Skin: Negative for color change and rash.  Neurological: Negative for tremors, syncope and headaches.  Psychiatric/Behavioral: Negative for confusion and dysphoric mood.     Physical Exam Updated Vital Signs BP (!) 153/91 (BP Location: Left Arm)    Pulse 78    Temp 98.5 F (36.9 C) (Oral)  Resp 18    Ht 6\' 1"  (1.854 m)    Wt 85.3 kg    SpO2 100%    BMI 24.80 kg/m   Physical Exam Vitals signs and nursing note reviewed.  Constitutional:      Appearance: He is well-developed.  HENT:     Head: Normocephalic and atraumatic.  Eyes:     Pupils: Pupils are equal, round, and reactive to light.  Neck:     Musculoskeletal: Normal range of motion and neck supple.     Vascular: No JVD.  Cardiovascular:     Rate and Rhythm: Normal rate and regular rhythm.     Heart sounds: No murmur. No friction rub. No gallop.   Pulmonary:     Effort: No respiratory distress.     Breath sounds: No wheezing.  Abdominal:     General: There is no distension.     Tenderness: There is no guarding or rebound.   Musculoskeletal: Normal range of motion.        General: Swelling and tenderness present.     Comments: Tenderness swelling and warmth to the olecranon.  There is a small lesion to the lateral aspect that is firm and nontender.  No opening to the skin.  Full range of motion of the elbow without significant tenderness.  Skin:    Coloration: Skin is not pale.     Findings: No rash.  Neurological:     Mental Status: He is alert and oriented to person, place, and time.  Psychiatric:        Behavior: Behavior normal.      ED Treatments / Results  Labs (all labs ordered are listed, but only abnormal results are displayed) Labs Reviewed - No data to display  EKG None  Radiology No results found.  Procedures .Marland KitchenIncision and Drainage Date/Time: 12/19/2018 10:16 AM Performed by: Melene Plan, DO Authorized by: Melene Plan, DO   Consent:    Consent obtained:  Verbal   Consent given by:  Patient   Risks discussed:  Bleeding, incomplete drainage, infection and damage to other organs   Alternatives discussed:  No treatment, delayed treatment and alternative treatment Location:    Type:  Bursa   Size:  Golf ball   Location:  Upper extremity   Upper extremity location:  Elbow Pre-procedure details:    Skin preparation:  Chloraprep Anesthesia (see MAR for exact dosages):    Anesthesia method:  Local infiltration   Local anesthetic:  Lidocaine 2% WITH epi Procedure type:    Complexity:  Complex Procedure details:    Needle aspiration: no     Incision types:  Single straight   Incision depth:  Subcutaneous   Scalpel blade:  11   Wound management:  Probed and deloculated   Drainage:  Serosanguinous   Drainage amount:  Moderate   Wound treatment:  Wound left open   Packing materials:  None Post-procedure details:    Patient tolerance of procedure:  Tolerated well, no immediate complications   (including critical care time)  Medications Ordered in ED Medications   lidocaine-EPINEPHrine (XYLOCAINE W/EPI) 2 %-1:200000 (PF) injection 10 mL (10 mLs Intradermal Given by Other 12/19/18 8366)     Initial Impression / Assessment and Plan / ED Course  I have reviewed the triage vital signs and the nursing notes.  Pertinent labs & imaging results that were available during my care of the patient were reviewed by me and considered in my medical decision making (see chart for details).  53 yo M with a chief complaints of right elbow pain.  Clinically the patient has olecranon bursitis.  Will treat with an I&D.  Significant amount of drainage but no purulence.  Will start on antibiotics.  PCP follow-up.  10:17 AM:  I have discussed the diagnosis/risks/treatment options with the patient and family and believe the pt to be eligible for discharge home to follow-up with PCP. We also discussed returning to the ED immediately if new or worsening sx occur. We discussed the sx which are most concerning (e.g., sudden worsening pain, fever, inability to tolerate by mouth) that necessitate immediate return. Medications administered to the patient during their visit and any new prescriptions provided to the patient are listed below.  Medications given during this visit Medications  lidocaine-EPINEPHrine (XYLOCAINE W/EPI) 2 %-1:200000 (PF) injection 10 mL (10 mLs Intradermal Given by Other 12/19/18 [id number])     The patient appears reasonably screen and/or stabilized for discharge and I doubt any other medical condition or other [facility] requiring further screening, evaluation, or treatment in the ED at this time prior to discharge.    Final Clinical Impressions(s) / ED Diagnoses   Final diagnoses:  Olecranon bursitis of right elbow    ED Discharge Orders         Ordered    doxycycline (VIBRAMYCIN) 100 MG capsule  2 times daily     12/19/18 1014           Mustang RidgeFloyd, Yanisa Goodgame, DO 12/19/18 1017

## 2018-12-19 NOTE — ED Triage Notes (Signed)
Pt states he fell onto his right elbow 2 days ago and it remains swollen and painful to touch. He had to leave work and come here due to painful with movement at work. Denies any other c/o.
